# Patient Record
Sex: Male | Born: 1975 | Race: White | Hispanic: No | Marital: Married
Health system: Southern US, Community
[De-identification: ages and names within clinical notes are randomized; demographics above are authoritative.]

## PROBLEM LIST (undated history)

## (undated) DIAGNOSIS — M1990 Unspecified osteoarthritis, unspecified site: Secondary | ICD-10-CM

## (undated) DIAGNOSIS — T7840XA Allergy, unspecified, initial encounter: Secondary | ICD-10-CM

## (undated) DIAGNOSIS — Z5189 Encounter for other specified aftercare: Secondary | ICD-10-CM

## (undated) HISTORY — DX: Allergy, unspecified, initial encounter: T78.40XA

## (undated) HISTORY — DX: Unspecified osteoarthritis, unspecified site: M19.90

## (undated) HISTORY — DX: Encounter for other specified aftercare: Z51.89

## (undated) HISTORY — PX: ANTERIOR CRUCIATE LIGAMENT REPAIR: SHX115

---

## 2008-07-25 ENCOUNTER — Ambulatory Visit: Payer: Self-pay | Admitting: Dermatology

## 2010-07-23 ENCOUNTER — Ambulatory Visit: Payer: Self-pay | Admitting: Dermatology

## 2011-12-19 IMAGING — CR DG CHEST 2V
1 series · 2 of 2 positions shown · non-contrast
Comparison: none

REASON FOR EXAM: pos ppd
COMMENTS:

[Series 1: view not recorded · 0.17mm/px · 2 of 2 slices shown]
[im 1/2]
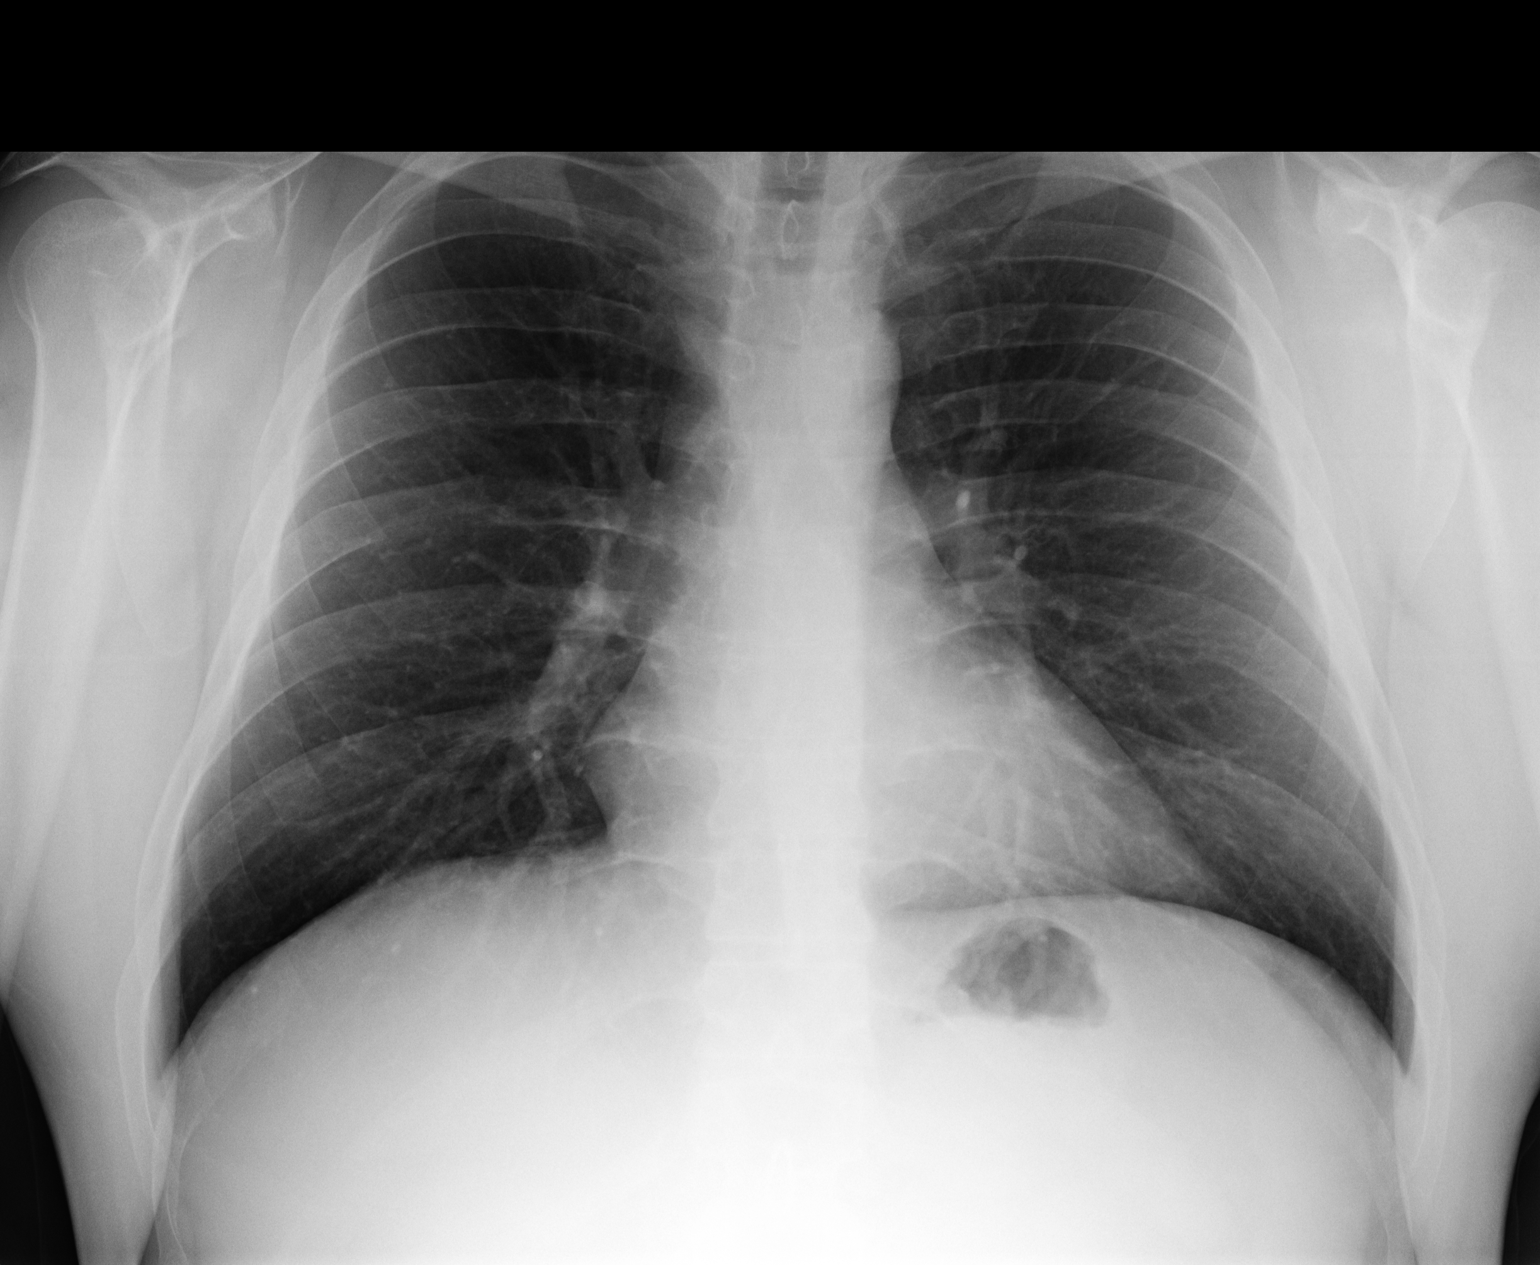
[im 2/2]
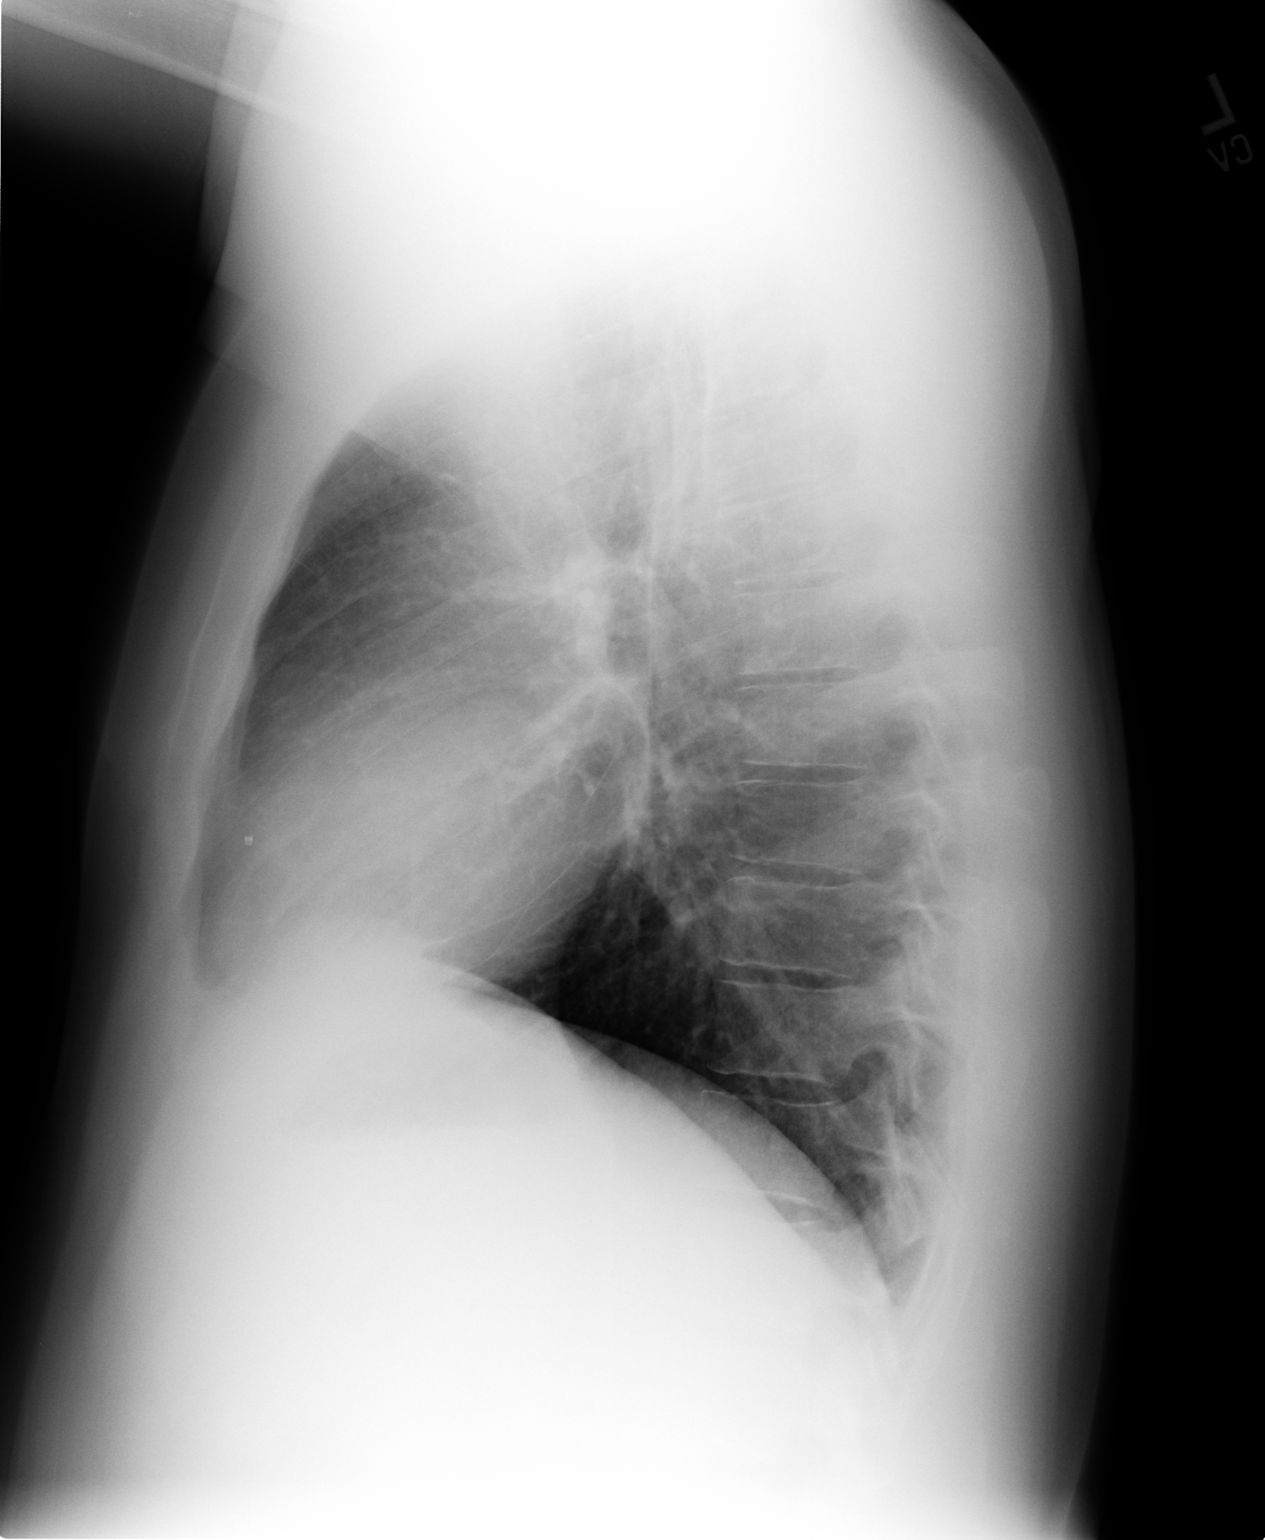

[2 of 2 positions shown; findings below may reference images not displayed]

PROCEDURE:     KDR - KDXR CHEST PA (OR AP) AND LAT  - July 23, 2010  [DATE]

RESULT:     Comparison is made to a prior exam 07/25/2008. The lung fields
remain clear. No pneumonia, pneumothorax or pleural effusion is seen. The
heart, mediastinal and osseous structures show no significant abnormalities.
Attention to the lung apices shows no infiltrate or cystic changes
suspicious for tuberculosis.
IMPRESSION: No significant abnormalities are noted.

## 2020-06-05 ENCOUNTER — Other Ambulatory Visit: Payer: Self-pay

## 2020-06-05 ENCOUNTER — Ambulatory Visit (INDEPENDENT_AMBULATORY_CARE_PROVIDER_SITE_OTHER): Payer: BC Managed Care – PPO | Admitting: Dermatology

## 2020-06-05 DIAGNOSIS — L409 Psoriasis, unspecified: Secondary | ICD-10-CM

## 2020-06-05 DIAGNOSIS — L4050 Arthropathic psoriasis, unspecified: Secondary | ICD-10-CM | POA: Diagnosis not present

## 2020-06-05 MED ORDER — ENBREL 50 MG/ML ~~LOC~~ SOSY: 50.0000 mg | PREFILLED_SYRINGE | SUBCUTANEOUS | 2 refills | Status: DC

## 2020-06-05 NOTE — Progress Notes (Signed)
   Follow-Up Visit   Subjective  Christopher Blankenship is a 44 y.o. male who presents for the following: Psoriasis (with psoritic arthritis of left knee, left arm, scalp - Enbrel 50 mg 2 times per week).    The following portions of the chart were reviewed this encounter and updated as appropriate:      Review of Systems:  No other skin or systemic complaints except as noted in HPI or Assessment and Plan.  Objective  Well appearing patient in no apparent distress; mood and affect are within normal limits.  A focused examination was performed including arms, legs, scalp. Relevant physical exam findings are noted in the Assessment and Plan.  Objective  Left knee, left arm, post scalp: 4.0 cm plaque of left knee.  1.5 cm plaque of left elbow.  6.0 x 3.0 cm plaque of post scalp.  No obvious joint swelling today.  Images             Assessment & Plan  Psoriasis Left knee, left arm, post scalp With Psoriatic Arthritis - worse if he is off medication.  Psoriasis is a chronic genetic/hereditary condition that is not curable.  It is a systemic condition that can involve multiple organ systems.  This patient has arthritic changes and psoriatic arthritis.  His "biologic" medication which is a systemic shot has potential side effects including immunosuppression and requires of laboratory monitoring and periodic follow-ups for monitoring of side effects.  He has seen Dr. Gavin Potters in the past and was diagnosed with psoriatic arthritis.  Discussed Humira, Cosentyx and Taltz.  Cosentyx and Taltz are not TNF inhibitors like Humira and Enbrel.  Cosentyx and Taltz have potential ability to help his joints as well as his current Enbrel but likely would do a better job in improving his skin psoriasis.   Patient would like to continue taking Enbrel since it is keeping his joint aches under control.  Conitnue Enbrel 50 mg 2 SQ qweek  Comprehensive metabolic panel - Left knee, left arm,  post scalp  CBC with Differential/Platelet - Left knee, left arm, post scalp  QuantiFERON-TB Gold Plus - Left knee, left arm, post scalp  etanercept (ENBREL) 50 MG/ML injection - Left knee, left arm, post scalp  Return in about 6 months (around 12/04/2020) for Psoriasis.  I, Joanie Coddington, CMA, am acting as scribe for Armida Sans, MD .  Documentation: I have reviewed the above documentation for accuracy and completeness, and I agree with the above.  Armida Sans, MD

## 2020-06-06 ENCOUNTER — Encounter: Payer: Self-pay | Admitting: Dermatology

## 2020-06-11 ENCOUNTER — Other Ambulatory Visit: Payer: Self-pay

## 2020-06-11 MED ORDER — ENBREL SURECLICK 50 MG/ML ~~LOC~~ SOAJ: 50.0000 mg | SUBCUTANEOUS | 5 refills | Status: DC

## 2020-06-11 NOTE — Progress Notes (Signed)
Clarification on Enbrel RX for [employer]

## 2020-06-18 ENCOUNTER — Telehealth: Payer: Self-pay

## 2020-06-18 LAB — CBC WITH DIFFERENTIAL/PLATELET
Basophils Absolute: 0.1 10*3/uL (ref 0.0–0.2)
Basos: 1 %
EOS (ABSOLUTE): 0.1 10*3/uL (ref 0.0–0.4)
Eos: 1 %
Hematocrit: 45.3 % (ref 37.5–51.0)
Hemoglobin: 15.6 g/dL (ref 13.0–17.7)
Immature Grans (Abs): 0 10*3/uL (ref 0.0–0.1)
Immature Granulocytes: 0 %
Lymphocytes Absolute: 2 10*3/uL (ref 0.7–3.1)
Lymphs: 23 %
MCH: 30.1 pg (ref 26.6–33.0)
MCHC: 34.4 g/dL (ref 31.5–35.7)
MCV: 88 fL (ref 79–97)
Monocytes Absolute: 0.6 10*3/uL (ref 0.1–0.9)
Monocytes: 7 %
Neutrophils Absolute: 5.9 10*3/uL (ref 1.4–7.0)
Neutrophils: 68 %
Platelets: 330 10*3/uL (ref 150–450)
RBC: 5.18 x10E6/uL (ref 4.14–5.80)
RDW: 12.7 % (ref 11.6–15.4)
WBC: 8.7 10*3/uL (ref 3.4–10.8)

## 2020-06-18 LAB — COMPREHENSIVE METABOLIC PANEL
ALT: 32 IU/L (ref 0–44)
AST: 21 IU/L (ref 0–40)
Albumin/Globulin Ratio: 1.8 (ref 1.2–2.2)
Albumin: 4.9 g/dL (ref 4.0–5.0)
Alkaline Phosphatase: 87 IU/L (ref 44–121)
BUN/Creatinine Ratio: 14 (ref 9–20)
BUN: 14 mg/dL (ref 6–24)
Bilirubin Total: 0.5 mg/dL (ref 0.0–1.2)
CO2: 25 mmol/L (ref 20–29)
Calcium: 9.9 mg/dL (ref 8.7–10.2)
Chloride: 102 mmol/L (ref 96–106)
Creatinine, Ser: 0.98 mg/dL (ref 0.76–1.27)
GFR calc Af Amer: 106 mL/min/{1.73_m2} (ref >59)
GFR calc non Af Amer: 92 mL/min/{1.73_m2} (ref >59)
Globulin, Total: 2.8 g/dL (ref 1.5–4.5)
Glucose: 124 mg/dL — ABNORMAL HIGH (ref 65–99)
Potassium: 4.4 mmol/L (ref 3.5–5.2)
Sodium: 141 mmol/L (ref 134–144)
Total Protein: 7.7 g/dL (ref 6.0–8.5)

## 2020-06-18 LAB — QUANTIFERON-TB GOLD PLUS
QuantiFERON Mitogen Value: >10 IU/mL
QuantiFERON Nil Value: 0.01 IU/mL
QuantiFERON TB1 Ag Value: 0.02 IU/mL
QuantiFERON TB2 Ag Value: 0.03 IU/mL
QuantiFERON-TB Gold Plus: NEGATIVE

## 2020-06-18 NOTE — Telephone Encounter (Signed)
Unable to leave a message.

## 2020-06-18 NOTE — Telephone Encounter (Signed)
-----   Message from Christopher Evener, MD sent at 06/18/2020 12:58 PM EST ----- Lab is all OK. Glucose slightly elevated.  Pt should discuss with PCP. Chemistries including liver; kidney tests all OK. Blood counts all normal / OK. TB test = Quantiferon Gold = negative/normal  Continue Enbrel.  Send Rx in. Keep 6 mos follow up appt.

## 2020-06-19 ENCOUNTER — Telehealth: Payer: Self-pay

## 2020-06-19 NOTE — Telephone Encounter (Signed)
Called patient but unable to leave message. The MAs are trying to reach him about his results but I also need to speak with patient regarding Enbrel denial and insurance coverage.

## 2020-06-24 ENCOUNTER — Telehealth: Payer: Self-pay

## 2020-06-24 NOTE — Telephone Encounter (Signed)
Patient left a message on the nurse line asking about Enbrel. Denial is on my desk.  Called patient back but no answer and no option for VM.

## 2020-06-24 NOTE — Telephone Encounter (Signed)
Tried calling patient this AM regarding Embrel denial and BX results.   NO answer and no option to leave voicemail.

## 2020-06-26 ENCOUNTER — Telehealth: Payer: Self-pay

## 2020-06-26 NOTE — Telephone Encounter (Signed)
LM on VM please call here to discuss lab results

## 2020-06-26 NOTE — Telephone Encounter (Signed)
We discussed recommendations in note last visit. I would start Taltz. May send RX Keep appts.

## 2020-06-26 NOTE — Telephone Encounter (Signed)
-----   Message from David C Kowalski, MD sent at 06/18/2020 12:58 PM EST ----- °Lab is all OK. °Glucose slightly elevated.  Pt should discuss with PCP. °Chemistries including liver; kidney tests all OK. °Blood counts all normal / OK. °TB test = Quantiferon Gold = negative/normal ° °Continue Enbrel.  Send Rx in. °Keep 6 mos follow up appt. ° °

## 2020-06-26 NOTE — Telephone Encounter (Signed)
Patient advised of lab results today.

## 2020-06-26 NOTE — Telephone Encounter (Signed)
Patient was advised today that Enbrel is no longer covered by insurance. His insurance prefers: Delrae Sawyers, Cristy Folks, Jeannie Done and [place].   Patient is willing to change therapies as long as the new medication still helps with the arthritis/joint pain.

## 2020-07-01 ENCOUNTER — Other Ambulatory Visit: Payer: Self-pay

## 2020-07-01 MED ORDER — TALTZ 80 MG/ML ~~LOC~~ SOAJ: 80.0000 mg | SUBCUTANEOUS | 1 refills | Status: DC

## 2020-07-01 MED ORDER — TALTZ 80 MG/ML ~~LOC~~ SOAJ: 80.0000 mg | Freq: Once | SUBCUTANEOUS | 0 refills | Status: AC

## 2020-07-01 MED ORDER — TALTZ 80 MG/ML ~~LOC~~ SOAJ: 160.0000 mg | SUBCUTANEOUS | 0 refills | Status: DC

## 2020-07-01 NOTE — Progress Notes (Signed)
Taltz RX to [employer]

## 2020-07-01 NOTE — Telephone Encounter (Signed)
Taltz sent in and left message for patient to return my call.

## 2020-07-01 NOTE — Telephone Encounter (Signed)
Patient advised of information. 

## 2020-08-12 ENCOUNTER — Other Ambulatory Visit: Payer: Self-pay

## 2020-08-12 ENCOUNTER — Other Ambulatory Visit: Payer: BC Managed Care – PPO

## 2020-08-12 DIAGNOSIS — Z20822 Contact with and (suspected) exposure to covid-19: Secondary | ICD-10-CM

## 2020-08-18 LAB — SARS-COV-2, NAA 2 DAY TAT

## 2020-08-18 LAB — NOVEL CORONAVIRUS, NAA: SARS-CoV-2, NAA: DETECTED — AB

## 2020-12-03 ENCOUNTER — Ambulatory Visit: Payer: BC Managed Care – PPO | Admitting: Dermatology

## 2020-12-03 ENCOUNTER — Other Ambulatory Visit: Payer: Self-pay

## 2020-12-03 DIAGNOSIS — L409 Psoriasis, unspecified: Secondary | ICD-10-CM

## 2020-12-03 DIAGNOSIS — L4050 Arthropathic psoriasis, unspecified: Secondary | ICD-10-CM

## 2020-12-03 MED ORDER — WYNZORA 0.005-0.064 % EX CREA: TOPICAL_CREAM | CUTANEOUS | 3 refills | Status: DC

## 2020-12-03 MED ORDER — TALTZ 80 MG/ML ~~LOC~~ SOAJ: 80.0000 mg | SUBCUTANEOUS | 5 refills | Status: DC

## 2020-12-03 NOTE — Progress Notes (Signed)
   Follow-Up Visit   Subjective  Christopher Blankenship is a 45 y.o. male who presents for the following: Psoriasis (Patient had significant pain and swelling of the feet in January around the same time he had Covid. He has been using the Taltz SQ QM which he started in early to late November. Patient states that joint pain and psoriasis has been about the same since switching to Taltz, but now he has a yellow discoloration of the toenails).  The following portions of the chart were reviewed this encounter and updated as appropriate:   Allergies  Meds  Problems  Med Hx  Surg Hx  Fam Hx     Review of Systems:  No other skin or systemic complaints except as noted in HPI or Assessment and Plan.  Objective  Well appearing patient in no apparent distress; mood and affect are within normal limits.  A focused examination was performed including the scalp and extremities. Relevant physical exam findings are noted in the Assessment and Plan.  Objective  trunk, extremities, scalp: L knee plaque 5.0 cm, L elbow 2.0 cm , 8.0 x 3.0 cm on the post scalp, R lat ankle 2.0 cm plaque - yellow discoloration at the tips of the toenails, psoriatic nail changes  Assessment & Plan  Psoriasis of the skin with psoriatic arthritis-severe on systemic biologic Taltz injections.  He is doing pretty well with the skin but has persistence of skin lesions.  His joints are also having persistent aches but doing pretty well.  As compared to being on Enbrel in the past, he is doing at least as well with Taltz. trunk, extremities, scalp With PsA - Flare of the joints which has now calmed down which may have been related to Covid infection in January Continue Taltz SQ injections QM. Reviewed risks of biologics including immunosuppression, infections, injection site reaction, and failure to improve condition. Goal is control of skin condition, not cure.  Some older biologics such as Humira and Enbrel may slightly increase risk of  malignancy and may worsen congestive heart failure. The use of biologics requires long term medication management, including periodic office visits and monitoring of blood work.  Start topical Wynzora QD-BID 5d/wk to aa's for skin psoriasis.   Advised patient if is experiencing joint pain he should follow up with his rheumatologist Dr. Gavin Potters. Patient demonstrated understanding and will contact him if/when joint flares occur or if he feels he needs more help with his joints.  I encouraged the patient to see Dr. [facility]le/rheumatologist.  Calcipotriene-Betameth Diprop [facility]) 0.005-0.064 % CREA - trunk, extremities, scalp  Ixekizumab (TALTZ) 80 MG/ML SOAJ - trunk, extremities, scalp  Other Related Medications etanercept (ENBREL) 50 MG/ML injection  Return in about 6 months (around 06/04/2021) for psoriasis follow up . Maylene Roes, CMA, am acting as scribe for Armida Sans, MD .  Documentation: I have reviewed the above documentation for accuracy and completeness, and I agree with the above.  Armida Sans, MD

## 2020-12-03 NOTE — Patient Instructions (Signed)

## 2020-12-04 ENCOUNTER — Encounter: Payer: Self-pay | Admitting: Dermatology

## 2020-12-10 ENCOUNTER — Other Ambulatory Visit: Payer: Self-pay

## 2020-12-10 ENCOUNTER — Other Ambulatory Visit: Payer: Self-pay | Admitting: Dermatology

## 2020-12-10 DIAGNOSIS — L409 Psoriasis, unspecified: Secondary | ICD-10-CM

## 2020-12-10 MED ORDER — TALTZ 80 MG/ML ~~LOC~~ SOAJ: 80.0000 mg | SUBCUTANEOUS | 5 refills | Status: DC

## 2021-05-12 ENCOUNTER — Other Ambulatory Visit: Payer: Self-pay | Admitting: Dermatology

## 2021-05-12 DIAGNOSIS — L409 Psoriasis, unspecified: Secondary | ICD-10-CM

## 2021-06-10 ENCOUNTER — Other Ambulatory Visit: Payer: Self-pay

## 2021-06-10 ENCOUNTER — Ambulatory Visit: Payer: Commercial Managed Care - PPO | Admitting: Dermatology

## 2021-06-10 DIAGNOSIS — L4050 Arthropathic psoriasis, unspecified: Secondary | ICD-10-CM

## 2021-06-10 DIAGNOSIS — L409 Psoriasis, unspecified: Secondary | ICD-10-CM

## 2021-06-10 MED ORDER — TALTZ 80 MG/ML ~~LOC~~ SOAJ: SUBCUTANEOUS | 6 refills | Status: DC

## 2021-06-10 MED ORDER — ZORYVE 0.3 % EX CREA: 1.0000 "application " | TOPICAL_CREAM | Freq: Every day | CUTANEOUS | 3 refills | Status: DC

## 2021-06-10 NOTE — Progress Notes (Signed)
   Follow-Up Visit   Subjective  Christopher Blankenship is a 45 y.o. male who presents for the following: Psoriasis (6 months f/u Psoriasis on the knees, elbows, pt taking Taltx injections once a month with a fair response on his skin,  helping his Psoriatic arthritis. ). Pt report Wynzora cream was too expensive so he did not pick that rx up from the pharmacy.   The following portions of the chart were reviewed this encounter and updated as appropriate:   Allergies  Meds  Problems  Med Hx  Surg Hx  Fam Hx     Review of Systems:  No other skin or systemic complaints except as noted in HPI or Assessment and Plan.  Objective  Well appearing patient in no apparent distress; mood and affect are within normal limits.  A focused examination was performed including face,exts,trunk. Relevant physical exam findings are noted in the Assessment and Plan.  exts, trunk Well-demarcated erythematous papules/plaques with silvery scale.    Assessment & Plan  Psoriasis with psoriatic arthritis. He is doing well on monthly Taltz injections.  His skin psoriasis is mostly clear with only a few areas residual.  His psoriatic arthritis is persistent but it is better.  He declines rheumatology evaluation today. exts, trunk Add topical Zoryve today. Samples given. Rx sent.  Psoriasis is a chronic non-curable, but treatable genetic/hereditary disease that may have other systemic features affecting other organ systems such as joints (Psoriatic Arthritis). It is associated with an increased risk of inflammatory bowel disease, heart disease, non-alcoholic fatty liver disease, and depression.     Discussed [place] may be an option in the future to add to [place]. Side effects of Otezla (apremilast) include diarrhea, nausea, headache, upper respiratory infection, depression, and weight decrease (5-10%). It should only be taken by pregnant women after a discussion regarding risks and benefits with their doctor. Goal is  control of skin condition, not cure.  The use of [place] requires long term medication management, including periodic office visits.   Related Procedures Comprehensive metabolic panel CBC with Differential/Platelet QuantiFERON-TB Gold Plus  Related Medications Roflumilast (ZORYVE) 0.3 % CREA Apply 1 application topically daily.  Ixekizumab (TALTZ) 80 MG/ML SOAJ INJECT ONE PEN SUBCUTANEOUSLY EVERY 4 WEEKS. REFRIGERATE. ALLOW PEN TO REACH ROOM TEMP PRIOR TO INJECTION.  Return in about 6 months (around 12/08/2021) for Psoriasis, .  I, Angelique Holm, CMA, am acting as scribe for Armida Sans, MD .  Documentation: I have reviewed the above documentation for accuracy and completeness, and I agree with the above.  Armida Sans, MD

## 2021-06-10 NOTE — Patient Instructions (Signed)

## 2021-06-11 ENCOUNTER — Encounter: Payer: Self-pay | Admitting: Dermatology

## 2021-12-07 ENCOUNTER — Other Ambulatory Visit: Payer: Self-pay | Admitting: Dermatology

## 2021-12-07 DIAGNOSIS — L409 Psoriasis, unspecified: Secondary | ICD-10-CM

## 2021-12-09 ENCOUNTER — Other Ambulatory Visit: Payer: Self-pay

## 2021-12-09 ENCOUNTER — Ambulatory Visit: Payer: Commercial Managed Care - PPO | Admitting: Dermatology

## 2021-12-09 DIAGNOSIS — L4050 Arthropathic psoriasis, unspecified: Secondary | ICD-10-CM

## 2021-12-09 DIAGNOSIS — L409 Psoriasis, unspecified: Secondary | ICD-10-CM | POA: Diagnosis not present

## 2021-12-09 MED ORDER — VTAMA 1 % EX CREA: 1.0000 "application " | TOPICAL_CREAM | Freq: Every day | CUTANEOUS | 6 refills | Status: DC

## 2021-12-09 MED ORDER — TALTZ 80 MG/ML ~~LOC~~ SOAJ: SUBCUTANEOUS | 6 refills | Status: DC

## 2021-12-09 NOTE — Progress Notes (Signed)
? ?  Follow-Up Visit ?  ?Subjective  ?Christopher Blankenship is a 46 y.o. male who presents for the following: Psoriasis (6 month follow up. Currently injecting Taltz 80 mg injections every 4 weeks. Patient reports unable to start zoryve due to cost. He feels arthritis has improved on taltz and he has stayed stable. He still has some active areas at scalp, elbows, left knee and ankles. ). ? ?The following portions of the chart were reviewed this encounter and updated as appropriate:  Allergies  Meds  Problems  Med Hx  Surg Hx  Fam Hx   ?  ?Review of Systems: No other skin or systemic complaints except as noted in HPI or Assessment and Plan. ? ?Objective  ?Well appearing patient in no apparent distress; mood and affect are within normal limits. ? ?A focused examination was performed including scalp, arms, legs, ankles and feet. Relevant physical exam findings are noted in the Assessment and Plan. ? ?extremites and trunk ?6 cm plaque at left knee, 2 cm plaque at left lateral elbow, 2 cm plaque at left lateral ankle and scaly plaque at left lower scalp.  ? ? ? ? ? ? ? ? ? ? ? ?Assessment & Plan  ?Psoriasis ?extremites and trunk ? ?Psoriasis is a chronic non-curable, but treatable genetic/hereditary disease that may have other systemic features affecting other organ systems such as joints (Psoriatic Arthritis). It is associated with an increased risk of inflammatory bowel disease, heart disease, non-alcoholic fatty liver disease, and depression.   ? ?Psoriasis with psoriatic arthritis. ?He is doing well on monthly Taltz injections.  His skin psoriasis is mostly clear with only a few areas residual.   ?His psoriatic arthritis is persistent but it is better.  He declines rheumatology evaluation today. ? ?Chronic and persistent condition with duration or expected duration over one year. Condition is symptomatic/ bothersome to patient. Not currently at goal. ? ?Long term medication management.  Patient is using long term  (months to years) prescription medication  to control their dermatologic condition.  These medications require periodic monitoring to evaluate for efficacy and side effects and may require periodic laboratory monitoring.  ?  ?Unable to start Zoryve due to cost.  ?Discussed [place] may be an option but will not add at this time.  ? ?Start Vtama cream 1 % - (samples given in office) apply to aa's qd  ?Rx sent to [facility] (info given to pt) 6 rfs ? ?Continue Taltz 80 mg/ml soaj q4 weeks. Rx sent to [facility]. 6 rfs  ? ?Lab is due.  Ordered lab. ? ?Ixekizumab (TALTZ) 80 MG/ML SOAJ - extremites and trunk ?INJECT ONE PEN SUBCUTANEOUSLY EVERY 4 WEEKS. REFRIGERATE. ALLOW PEN TO REACH ROOM TEMP PRIOR TO INJECTION. ?Tapinarof (VTAMA) 1 % CREA - extremites and trunk ?Apply 1 application. topically daily. Apply to aa's of body for psoriasis ? ?Return in about 6 months (around 06/11/2022) for psoriasis. ?I, Asher Muir, CMA, am acting as scribe for Armida Sans, MD. ?Documentation: I have reviewed the above documentation for accuracy and completeness, and I agree with the above. ? ?Armida Sans, MD ? ?

## 2021-12-09 NOTE — Patient Instructions (Signed)

## 2021-12-22 ENCOUNTER — Encounter: Payer: Self-pay | Admitting: Dermatology

## 2021-12-23 ENCOUNTER — Telehealth: Payer: Self-pay

## 2021-12-23 NOTE — Addendum Note (Signed)
Addended by: Asher Muir A on: 12/23/2021 08:17 AM ? ? Modules accepted: Orders ? ?

## 2021-12-23 NOTE — Telephone Encounter (Signed)
Spoke with patient and he is scheduled to see his PCP coming up, Dr. Dewaine Oats in [place]. Lab order faxed to his office to include our labs. aw ?

## 2021-12-23 NOTE — Telephone Encounter (Signed)
Tried calling patient to verify if he had his biologic yearly labs done with another provider or if we needed to order labs for him. Patient is currently on taltz for psoriasis. Left message for patient to return call to office.  ? ? ?

## 2021-12-28 ENCOUNTER — Telehealth: Payer: Self-pay

## 2021-12-28 NOTE — Telephone Encounter (Signed)
Patient called to let us know his PCP visit and lab work is scheduled to be done 02/15/22. aw

## 2022-03-10 ENCOUNTER — Telehealth: Payer: Self-pay

## 2022-03-10 DIAGNOSIS — L409 Psoriasis, unspecified: Secondary | ICD-10-CM

## 2022-03-10 NOTE — Telephone Encounter (Addendum)
   Called patient regarding need for Quantiferon Gold lab test and lipid panel. Copy of labs received from patient's pcp shows most recent labs but did not include these labs.  Left message for patient to call back.   ----- Message from Deirdre Evener, MD sent at 03/04/2022  6:26 PM EDT ----- This pt had lab done 02/15/2022 that I reviewed showing CBC and chem that were OK. The lab sheet shows that Lipids and Quantiferon Gold test also ordered, but I see no results for those tests. Please look at his media scanned lab or I can show you the lab. Need to find Quantiferon gold results if done. thanks

## 2022-03-17 NOTE — Telephone Encounter (Signed)
Tried another attempt to reach out to patient regarding need for additional labs. Patient did not answer. LMOM for patient to call office.   Will place future orders for labs needed (Lipid Panel and Quantiferon Gold test) and leave with front desk for patient to pick up.

## 2022-03-17 NOTE — Telephone Encounter (Signed)
-----   Message from Deirdre Evener, MD sent at 03/16/2022  6:29 PM EDT ----- It looks like this pts PCP did the labs scanned into Media.  So he did not do the ones we ordered.  That is OK, but we need to have a yearly Quantiferon gold /TB test while pt is on Taltz. Please contact pt and advise and re-order for him if he needs an order. ----- Message ----- From: Marilynn Rail, CMA Sent: 03/08/2022   8:07 AM EDT To: Deirdre Evener, MD  Hi Dr. Kirtland Bouchard  I did put in orders for these test, I do see where patient had cmp and cbc, but I also do not see the other test they should have had done. I will reach out to patient later today and see what is going on.   Thank Annebelle Bostic ----- Message ----- From: Deirdre Evener, MD Sent: 03/04/2022   6:29 PM EDT To: Deirdre Evener, MD; Marilynn Rail, CMA  This pt had lab done 02/15/2022 that I reviewed showing CBC and chem that were OK. The lab sheet shows that Lipids and Quantiferon Gold test also ordered, but I see no results for those tests. Please look at his media scanned lab or I can show you the lab. Need to find Quantiferon gold results if done. thanks

## 2022-04-05 ENCOUNTER — Telehealth: Payer: Self-pay

## 2022-04-05 NOTE — Telephone Encounter (Signed)
-----   Message from Deirdre Evener, MD sent at 04/01/2022  5:41 PM EDT ----- See labs scanned in from PCP, Dr Arlana Pouch. 02/15/2022 lab shows: Blood counts and Chemistries are OK.  Need to get TB test / Quantiferon gold (if not already done) as pt is on Taltz. Please contact Dr Maree Krabbe office to make sure whether they did TB test.   If not, order Quantiferon Gold and have pt get done at his convenience.

## 2022-04-05 NOTE — Telephone Encounter (Signed)
Called pt discussed labs scanned in from PCP, Dr Arlana Pouch. 02/15/2022 lab shows: Blood counts and Chemistries are OK.   Called Dr Maree Krabbe office to have most recent TB test / Quantiferon gold sent here, receptionist faxing labs today.

## 2022-04-05 NOTE — Telephone Encounter (Signed)
Called patient. Advised Dr. Gwen Pounds has received the negative TB test results. Continue Taltz. KSA for 6 month follow up. JP

## 2022-06-24 ENCOUNTER — Other Ambulatory Visit: Payer: Self-pay | Admitting: Dermatology

## 2022-06-24 ENCOUNTER — Ambulatory Visit: Payer: Commercial Managed Care - PPO | Admitting: Dermatology

## 2022-06-24 DIAGNOSIS — L409 Psoriasis, unspecified: Secondary | ICD-10-CM

## 2022-06-24 DIAGNOSIS — L4050 Arthropathic psoriasis, unspecified: Secondary | ICD-10-CM

## 2022-06-24 DIAGNOSIS — Z79899 Other long term (current) drug therapy: Secondary | ICD-10-CM

## 2022-06-24 MED ORDER — TALTZ 80 MG/ML ~~LOC~~ SOAJ: SUBCUTANEOUS | 6 refills | Status: DC

## 2022-06-24 NOTE — Progress Notes (Signed)
   Follow-Up Visit   Subjective  Christopher Blankenship is a 46 y.o. male who presents for the following: Psoriasis (Hx at trunk extremities and scalp. On taltz and staying clear. ).  The following portions of the chart were reviewed this encounter and updated as appropriate:  Allergies  Meds  Problems  Med Hx  Surg Hx  Fam Hx     Review of Systems: No other skin or systemic complaints except as noted in HPI or Assessment and Plan.  Objective  Well appearing patient in no apparent distress; mood and affect are within normal limits.  A focused examination was performed including scalp, right lateral ankle, left distal elbow, left knee, arms. Relevant physical exam findings are noted in the Assessment and Plan.  extremities, trunk, scalp Scalp clear, minimal pinkness at knees and elbows  2 persistent plaques at right lateral ankle and left distal elbow   Assessment & Plan  Psoriasis extremities, trunk, scalp  Psoriasis is a chronic non-curable, but treatable genetic/hereditary disease that may have other systemic features affecting other organ systems such as joints (Psoriatic Arthritis). It is associated with an increased risk of inflammatory bowel disease, heart disease, non-alcoholic fatty liver disease, and depression.     Psoriasis with psoriatic arthritis. He is doing well on monthly Taltz injections.  His skin psoriasis is mostly clear with only a few areas residual.   His psoriatic arthritis is persistent but it is better.    Chronic and persistent condition with duration or expected duration over one year. Condition is symptomatic/ bothersome to patient. Not currently at goal.   Long term medication management.  Patient is using long term (months to years) prescription medication  to control their dermatologic condition.  These medications require periodic monitoring to evaluate for efficacy and side effects and may require periodic laboratory monitoring.    Unable to start  Zoryve due to cost.  Patient has tried vtama cream 1 % samples. Prefers not using any topicals at this time.    Continue Taltz 80 mg/ml soaj q4 weeks. [facility]. 6 rfs   Order labs test at next follow up  Related Medications Tapinarof (VTAMA) 1 % CREA Apply 1 application. topically daily. Apply to aa's of body for psoriasis  Ixekizumab (TALTZ) 80 MG/ML SOAJ INJECT ONE PEN SUBCUTANEOUSLY EVERY 4 WEEKS. REFRIGERATE. ALLOW PEN TO REACH ROOM TEMP PRIOR TO INJECTION.  Long-term use of high-risk medication  Medication management  Psoriatic arthritis (HCC)   Return in about 6 months (around 12/23/2022) for psoriasis. IAsher Muir, CMA, am acting as scribe for Armida Sans, MD. Documentation: I have reviewed the above documentation for accuracy and completeness, and I agree with the above.  Armida Sans, MD

## 2022-06-24 NOTE — Patient Instructions (Addendum)
Ask PCP to order TB Gold lab at next labs in July   .     Due to recent changes in healthcare laws, you may see results of your pathology and/or laboratory studies on MyChart before the doctors have had a chance to review them. We understand that in some cases there may be results that are confusing or concerning to you. Please understand that not all results are received at the same time and often the doctors may need to interpret multiple results in order to provide you with the best plan of care or course of treatment. Therefore, we ask that you please give [place] 2 business days to thoroughly review all your results before contacting the office for clarification. Should we see a critical lab result, you will be contacted sooner.   If You Need Anything After Your Visit  If you have any questions or concerns for your doctor, please call our main line at [phone] and press option 4 to reach your doctor's medical assistant. If no one answers, please leave a voicemail as directed and we will return your call as soon as possible. Messages left after 4 pm will be answered the following business day.   You may also send [place] a message via MyChart. We typically respond to MyChart messages within 1-2 business days.  For prescription refills, please ask your pharmacy to contact our office. Our fax number is [phone].  If you have an urgent issue when the clinic is closed that cannot wait until the next business day, you can page your doctor at the number below.    Please note that while we do our best to be available for urgent issues outside of office hours, we are not available 24/7.   If you have an urgent issue and are unable to reach [place], you may choose to seek medical care at your doctor's office, retail clinic, urgent care center, or emergency room.  If you have a medical emergency, please immediately call 911 or go to the emergency department.  Pager Numbers  - Dr. Gwen Pounds:  [phone]  - Dr. Neale Burly: [phone]  - Dr. Roseanne Reno: [phone]  In the event of inclement weather, please call our main line at [phone] for an update on the status of any delays or closures.  Dermatology Medication Tips: Please keep the boxes that topical medications come in in order to help keep track of the instructions about where and how to use these. Pharmacies typically print the medication instructions only on the boxes and not directly on the medication tubes.   If your medication is too expensive, please contact our office at [phone] option 4 or send [place] a message through MyChart.   We are unable to tell what your co-pay for medications will be in advance as this is different depending on your insurance coverage. However, we may be able to find a substitute medication at lower cost or fill out paperwork to get insurance to cover a needed medication.   If a prior authorization is required to get your medication covered by your insurance company, please allow [place] 1-2 business days to complete this process.  Drug prices often vary depending on where the prescription is filled and some pharmacies may offer cheaper prices.  The website www.goodrx.com contains coupons for medications through different pharmacies. The prices here do not account for what the cost may be with help from insurance (it may be cheaper with your insurance), but the website can give you the price  if you did not use any insurance.  - You can print the associated coupon and take it with your prescription to the pharmacy.  - You may also stop by our office during regular business hours and pick up a GoodRx coupon card.  - If you need your prescription sent electronically to a different pharmacy, notify our office through [facility] or by phone at [phone] option 4.     Si Usted Necesita Algo Despus de Su Visita  Tambin puede enviarnos un mensaje a travs de Pharmacist, community. Por lo general  respondemos a los mensajes de MyChart en el transcurso de 1 a 2 das hbiles.  Para renovar recetas, por favor pida a su farmacia que se ponga en contacto con nuestra oficina. Harland Dingwall de fax es [facility] [phone].  Si tiene un asunto urgente cuando la clnica est cerrada y que no puede esperar hasta el siguiente da hbil, puede llamar/localizar a su doctor(a) al nmero que aparece a continuacin.   Por favor, tenga en cuenta que aunque hacemos todo lo posible para estar disponibles para asuntos urgentes fuera del horario de [place], no estamos disponibles las 24 horas del da, los 7 das de la [place].   Si tiene un problema urgente y no puede comunicarse con nosotros, puede optar por buscar atencin mdica  en el consultorio de su doctor(a), en una clnica privada, en un centro de atencin urgente o en una sala de emergencias.  Si tiene Engineering geologist, por favor llame inmediatamente al 911 o vaya a la sala de emergencias.  Nmeros de bper  - Dr. Nehemiah Massed: [phone]  - Dra. Moye: [phone]  - Dra. Nicole Kindred: [phone]  En caso de inclemencias del [place], por favor llame a Johnsie Kindred principal al [phone] para una actualizacin sobre el [place] de cualquier retraso o cierre.  Consejos para la medicacin en dermatologa: Por favor, guarde las cajas en las que vienen los medicamentos de uso tpico para ayudarle a seguir las instrucciones sobre dnde y cmo usarlos. Las farmacias generalmente imprimen las instrucciones del medicamento slo en las cajas y no directamente en los tubos del [place].   Si su medicamento es muy caro, por favor, pngase en contacto con Zigmund Daniel llamando al [phone] y presione la opcin 4 o envenos un mensaje a travs de Pharmacist, community.   No podemos decirle cul ser su copago por los medicamentos por adelantado ya que esto es diferente dependiendo de la cobertura de su seguro. Sin embargo, es posible que podamos encontrar un  medicamento sustituto a Electrical engineer un formulario para que el seguro cubra el medicamento que se considera necesario.   Si se requiere una autorizacin previa para que su compaa de seguros [place] su medicamento, por favor permtanos de 1 a 2 das hbiles para completar este proceso.  Los precios de los medicamentos varan con frecuencia dependiendo del Environmental consultant de dnde se surte la receta y alguna farmacias pueden ofrecer precios ms baratos.  El sitio web www.goodrx.com tiene cupones para medicamentos de Airline pilot. Los precios aqu no tienen en cuenta lo que podra costar con la ayuda del seguro (puede ser ms barato con su seguro), pero el sitio web puede darle el precio si no utiliz Research scientist (physical sciences).  - Puede imprimir el cupn correspondiente y llevarlo con su receta a la farmacia.  - Tambin puede pasar por nuestra oficina durante el horario de atencin regular y Charity fundraiser una tarjeta de cupones de GoodRx.  - Si necesita que su  receta se enve electrnicamente a una farmacia diferente, informe a nuestra oficina a travs de MyChart de [place] o por telfono llamando al [phone] y presione la opcin 4.

## 2022-07-13 ENCOUNTER — Encounter: Payer: Self-pay | Admitting: Dermatology

## 2022-12-30 ENCOUNTER — Ambulatory Visit: Payer: Commercial Managed Care - PPO | Admitting: Dermatology

## 2022-12-30 VITALS — BP 140/86 | Wt 171.6 lb

## 2022-12-30 DIAGNOSIS — Z7189 Other specified counseling: Secondary | ICD-10-CM | POA: Diagnosis not present

## 2022-12-30 DIAGNOSIS — L4050 Arthropathic psoriasis, unspecified: Secondary | ICD-10-CM | POA: Diagnosis not present

## 2022-12-30 DIAGNOSIS — B009 Herpesviral infection, unspecified: Secondary | ICD-10-CM

## 2022-12-30 DIAGNOSIS — L409 Psoriasis, unspecified: Secondary | ICD-10-CM

## 2022-12-30 DIAGNOSIS — Z79899 Other long term (current) drug therapy: Secondary | ICD-10-CM

## 2022-12-30 DIAGNOSIS — B001 Herpesviral vesicular dermatitis: Secondary | ICD-10-CM

## 2022-12-30 MED ORDER — TALTZ 80 MG/ML ~~LOC~~ SOAJ: 80.0000 mg | SUBCUTANEOUS | 6 refills | Status: DC

## 2022-12-30 MED ORDER — VALACYCLOVIR HCL 500 MG PO TABS: 500.0000 mg | ORAL_TABLET | Freq: Two times a day (BID) | ORAL | 11 refills | Status: DC

## 2022-12-30 NOTE — Patient Instructions (Signed)
Due to recent changes in healthcare laws, you may see results of your pathology and/or laboratory studies on MyChart before the doctors have had a chance to review them. We understand that in some cases there may be results that are confusing or concerning to you. Please understand that not all results are received at the same time and often the doctors may need to interpret multiple results in order to provide you with the best plan of care or course of treatment. Therefore, we ask that you please give us 2 business days to thoroughly review all your results before contacting the office for clarification. Should we see a critical lab result, you will be contacted sooner.   If You Need Anything After Your Visit  If you have any questions or concerns for your doctor, please call our main line at [phone] and press option 4 to reach your doctor's medical assistant. If no one answers, please leave a voicemail as directed and we will return your call as soon as possible. Messages left after 4 pm will be answered the following business day.   You may also send us a message via MyChart. We typically respond to MyChart messages within 1-2 business days.  For prescription refills, please ask your pharmacy to contact our office. Our fax number is [phone].  If you have an urgent issue when the clinic is closed that cannot wait until the next business day, you can page your doctor at the number below.    Please note that while we do our best to be available for urgent issues outside of office hours, we are not available 24/7.   If you have an urgent issue and are unable to reach us, you may choose to seek medical care at your doctor's office, retail clinic, urgent care center, or emergency room.  If you have a medical emergency, please immediately call 911 or go to the emergency department.  Pager Numbers  - Dr. Kowalski: [phone]  - Dr. Moye: [phone]  - Dr. Stewart:  [phone]  In the event of inclement weather, please call our main line at [phone] for an update on the status of any delays or closures.  Dermatology Medication Tips: Please keep the boxes that topical medications come in in order to help keep track of the instructions about where and how to use these. Pharmacies typically print the medication instructions only on the boxes and not directly on the medication tubes.   If your medication is too expensive, please contact our office at [phone] option 4 or send us a message through MyChart.   We are unable to tell what your co-pay for medications will be in advance as this is different depending on your insurance coverage. However, we may be able to find a substitute medication at lower cost or fill out paperwork to get insurance to cover a needed medication.   If a prior authorization is required to get your medication covered by your insurance company, please allow us 1-2 business days to complete this process.  Drug prices often vary depending on where the prescription is filled and some pharmacies may offer cheaper prices.  The website www.goodrx.com contains coupons for medications through different pharmacies. The prices here do not account for what the cost may be with help from insurance (it may be cheaper with your insurance), but the website can give you the price if you did not use any insurance.  - You can print the associated coupon and take it with   your prescription to the pharmacy.  - You may also stop by our office during regular business hours and pick up a GoodRx coupon card.  - If you need your prescription sent electronically to a different pharmacy, notify our office through [facility] MyChart or by phone at [phone] option 4.     Si Usted Necesita Algo Despus de Su Visita  Tambin puede enviarnos un mensaje a travs de MyChart. Por lo general respondemos a los mensajes de MyChart en el transcurso de 1 a 2  das hbiles.  Para renovar recetas, por favor pida a su farmacia que se ponga en contacto con nuestra oficina. Nuestro nmero de fax es el [phone].  Si tiene un asunto urgente cuando la clnica est cerrada y que no puede esperar hasta el siguiente da hbil, puede llamar/localizar a su doctor(a) al nmero que aparece a continuacin.   Por favor, tenga en cuenta que aunque hacemos todo lo posible para estar disponibles para asuntos urgentes fuera del horario de oficina, no estamos disponibles las 24 horas del da, los 7 das de la semana.   Si tiene un problema urgente y no puede comunicarse con nosotros, puede optar por buscar atencin mdica  en el consultorio de su doctor(a), en una clnica privada, en un centro de atencin urgente o en una sala de emergencias.  Si tiene una emergencia mdica, por favor llame inmediatamente al 911 o vaya a la sala de emergencias.  Nmeros de bper  - Dr. Kowalski: [phone]  - Dra. Moye: [phone]  - Dra. Stewart: [phone]  En caso de inclemencias del tiempo, por favor llame a nuestra lnea principal al [phone] para una actualizacin sobre el estado de cualquier retraso o cierre.  Consejos para la medicacin en dermatologa: Por favor, guarde las cajas en las que vienen los medicamentos de uso tpico para ayudarle a seguir las instrucciones sobre dnde y cmo usarlos. Las farmacias generalmente imprimen las instrucciones del medicamento slo en las cajas y no directamente en los tubos del medicamento.   Si su medicamento es muy caro, por favor, pngase en contacto con nuestra oficina llamando al [phone] y presione la opcin 4 o envenos un mensaje a travs de MyChart.   No podemos decirle cul ser su copago por los medicamentos por adelantado ya que esto es diferente dependiendo de la cobertura de su seguro. Sin embargo, es posible que podamos encontrar un medicamento sustituto a menor costo o llenar un formulario para que el  seguro cubra el medicamento que se considera necesario.   Si se requiere una autorizacin previa para que su compaa de seguros cubra su medicamento, por favor permtanos de 1 a 2 das hbiles para completar este proceso.  Los precios de los medicamentos varan con frecuencia dependiendo del lugar de dnde se surte la receta y alguna farmacias pueden ofrecer precios ms baratos.  El sitio web www.goodrx.com tiene cupones para medicamentos de diferentes farmacias. Los precios aqu no tienen en cuenta lo que podra costar con la ayuda del seguro (puede ser ms barato con su seguro), pero el sitio web puede darle el precio si no utiliz ningn seguro.  - Puede imprimir el cupn correspondiente y llevarlo con su receta a la farmacia.  - Tambin puede pasar por nuestra oficina durante el horario de atencin regular y recoger una tarjeta de cupones de GoodRx.  - Si necesita que su receta se enve electrnicamente a una farmacia diferente, informe a nuestra oficina a travs de MyChart de [place]   o por telfono llamando al [phone] y presione la opcin 4.  

## 2022-12-30 NOTE — Progress Notes (Signed)
   Follow-Up Visit   Subjective  Christopher Blankenship is a 47 y.o. male who presents for the following: Psoriasis with Psoriatic Arthritis trunk, arms, legs, scalp, Taltz sq injections q 4 wks, pt has lost a little bit of weight since starting Taltz  The following portions of the chart were reviewed this encounter and updated as appropriate: medications, allergies, medical history  Review of Systems:  No other skin or systemic complaints except as noted in HPI or Assessment and Plan.  Objective  Well appearing patient in no apparent distress; mood and affect are within normal limits.  Areas Examined: Arms, legs, scalp  Relevant exam findings are noted in the Assessment and Plan.  Assessment & Plan   Psoriasis  Related Procedures QuantiFERON-TB Gold Plus  Related Medications Tapinarof (VTAMA) 1 % CREA Apply 1 application. topically daily. Apply to aa's of body for psoriasis   PSORIASIS with PSORIATIC ARTHRITIS His skin psoriasis is mostly clear with only a few areas residual.   His psoriatic arthritis is persistent but it is better.   Scaly patch L elbow  1% BSA. Weight 171.6lbs Chronic condition with duration or expected duration over one year. Currently well-controlled.  Patient says joint pain controlled on Taltz  Treatment Plan: Cont Taltz sq injections q 4 wks Pt will have labs 07/24 with Dr. Arlana Pouch  Counseling and coordination of care for severe psoriasis on systemic treatment  psoriasis - severe on systemic treatment.  Psoriasis is a chronic non-curable, but treatable genetic/hereditary disease that may have other systemic features affecting other organ systems such as joints (Psoriatic Arthritis).  It is linked with heart disease, inflammatory bowel disease, non-alcoholic fatty liver disease, and depression. Significant skin psoriasis and/or psoriatic arthritis may have significant symptoms and affects activities of daily activity and often benefits from systemic  treatments.  These systemic treatments have some potential side effects including immunosuppression and require pre-treatment laboratory screening and periodic laboratory monitoring and periodic in person evaluation and monitoring by the attending dermatologist physician (long term medication management).   Reviewed risks of biologics including immunosuppression, infections, injection site reaction, and failure to improve condition. Goal is control of skin condition, not cure.  Some older biologics such as Humira and Enbrel may slightly increase risk of malignancy and may worsen congestive heart failure.  Taltz and Cosentyx may cause inflammatory bowel disease to flare. The use of biologics requires long term medication management, including periodic office visits and monitoring of blood work.   HERPESVIRAL INFECTION (COLD SORES) Exam: resolving blister L lower lip Chronic and persistent condition with duration or expected duration over one year. Condition is bothersome/symptomatic for patient. Currently flared.  Herpes Simplex Virus = Cold Sores = Fever Blisters is a chronic recurring blistering; scabbing sore-producing viral infection that is recurrent usually in the same area triggered by stress, sun/UV exposure and trauma.  It is infectious and can be spread from person to person by direct contact.  It is not curable, but is treatable with topical and oral medication.  Treatment Plan Take Valacyclovir 500mg  1 po bid for 5 days prn flares  Return in about 6 months (around 07/02/2023) for Psoriasis f/u.  I, Ardis Rowan, RMA, am acting as scribe for Armida Sans, MD .   Documentation: I have reviewed the above documentation for accuracy and completeness, and I agree with the above.  Armida Sans, MD

## 2023-01-12 ENCOUNTER — Encounter: Payer: Self-pay | Admitting: Dermatology

## 2023-02-17 ENCOUNTER — Telehealth: Payer: Self-pay

## 2023-02-17 LAB — QUANTIFERON-TB GOLD PLUS
QuantiFERON Mitogen Value: >10 IU/mL
QuantiFERON Nil Value: 0.05 IU/mL
QuantiFERON TB1 Ag Value: 0.06 IU/mL
QuantiFERON TB2 Ag Value: 0.08 IU/mL
QuantiFERON-TB Gold Plus: NEGATIVE

## 2023-02-17 NOTE — Telephone Encounter (Signed)
Discussed lab results with patient. 

## 2023-02-17 NOTE — Telephone Encounter (Signed)
-----   Message from Armida Sans sent at 02/17/2023 12:15 PM EDT ----- TB test / Quantiferon gold from 02/14/2023 is Negative = normal Continue Taltz for Psoriasis Keep follow up appt

## 2023-06-30 ENCOUNTER — Ambulatory Visit: Payer: Commercial Managed Care - PPO | Admitting: Dermatology

## 2023-06-30 DIAGNOSIS — L409 Psoriasis, unspecified: Secondary | ICD-10-CM | POA: Diagnosis not present

## 2023-06-30 DIAGNOSIS — Z79899 Other long term (current) drug therapy: Secondary | ICD-10-CM

## 2023-06-30 DIAGNOSIS — Z7189 Other specified counseling: Secondary | ICD-10-CM

## 2023-06-30 DIAGNOSIS — L4050 Arthropathic psoriasis, unspecified: Secondary | ICD-10-CM | POA: Diagnosis not present

## 2023-06-30 NOTE — Patient Instructions (Signed)

## 2023-06-30 NOTE — Progress Notes (Signed)
   Follow-Up Visit   Subjective  Christopher Blankenship is a 47 y.o. male who presents for the following: Psoriasis doing very well on Taltz, with no active psoriasis and no joint pain.   The following portions of the chart were reviewed this encounter and updated as appropriate: medications, allergies, medical history  Review of Systems:  No other skin or systemic complaints except as noted in HPI or Assessment and Plan.  Objective  Well appearing patient in no apparent distress; mood and affect are within normal limits.  Areas Examined: The face, trunk  Relevant exam findings are noted in the Assessment and Plan.   Assessment & Plan   PSORIASIS with psoriatic arthritis Tiny plaque on the L knee. 1% BSA on Taltz. Previously on Enbrel which did not control patient's psoraisis. Pt's insurance states that starting January 2025 they will no longer cover Altamease [place], even though the medication has been approved through 05/2024. Should that happen we will switch to Cosentyx.   Chronic condition with duration or expected duration over one year. Currently well-controlled.  Joint pain is not resolved but improved on Taltz  Treatment Plan: Continue Taltz SQ QM. Reviewed risks of biologics including immunosuppression, infections, injection site reaction, and failure to improve condition. Goal is control of skin condition, not cure.  Some older biologics such as Humira and Enbrel may slightly increase risk of malignancy and may worsen congestive heart failure.  Taltz and Cosentyx may cause inflammatory bowel disease to flare. The use of biologics requires long term medication management, including periodic office visits and monitoring of blood work.  Counseling on psoriasis and coordination of care  psoriasis is a chronic non-curable, but treatable genetic/hereditary disease that may have other systemic features affecting other organ systems such as joints (Psoriatic Arthritis). It is associated with an  increased risk of inflammatory bowel disease, heart disease, non-alcoholic fatty liver disease, and depression.  Treatments include light and laser treatments; topical medications; and systemic medications including oral and injectables.  Weight loss - being followed by Dr. Arlana Pouch, who has evaluated weight loss, and is not concerned per patient.  Return in about 6 months (around 12/28/2023) for TBSE and psoriasis follow up.  Maylene Roes, CMA, am acting as scribe for Armida Sans, MD .   Documentation: I have reviewed the above documentation for accuracy and completeness, and I agree with the above.  Armida Sans, MD

## 2023-07-04 ENCOUNTER — Telehealth: Payer: Self-pay

## 2023-07-04 NOTE — Telephone Encounter (Signed)
We had previously discussed switching to Cosentyx since patient's plan will no longer cover it starting January 2025. According to patient's insurance they cover Adalimumab-adaz, Bimzelx, Hyrimoz, [place], [place], [place], [place], and [place]. They cover Cosentyx for PsA, but not psoraisis alone. Please advise.

## 2023-07-05 ENCOUNTER — Encounter: Payer: Self-pay | Admitting: Dermatology

## 2023-07-06 MED ORDER — COSENTYX SENSOREADY (300 MG) 150 MG/ML ~~LOC~~ SOAJ: 300.0000 mg | SUBCUTANEOUS | 0 refills | Status: DC

## 2023-07-06 MED ORDER — COSENTYX SENSOREADY (300 MG) 150 MG/ML ~~LOC~~ SOAJ: 300.0000 mg | SUBCUTANEOUS | 5 refills | Status: DC

## 2023-07-06 NOTE — Telephone Encounter (Signed)
We need to hold Cosentyx until 2025 due to Cosentyx not preferred for 2024 calendar year. Aw Will send myself a reminder to send to [place] as of the New Year. aw

## 2023-07-06 NOTE — Addendum Note (Signed)
Addended by: Cari Caraway A on: 07/06/2023 08:10 AM   Modules accepted: Orders

## 2023-07-06 NOTE — Telephone Encounter (Signed)
Advised patient that we are starting process of switching to Cosentyx since his insurance will no longer cover Taltz starting in January.

## 2023-07-21 ENCOUNTER — Other Ambulatory Visit: Payer: Self-pay | Admitting: Dermatology

## 2023-07-21 DIAGNOSIS — L4050 Arthropathic psoriasis, unspecified: Secondary | ICD-10-CM

## 2023-07-21 DIAGNOSIS — L409 Psoriasis, unspecified: Secondary | ICD-10-CM

## 2023-08-17 ENCOUNTER — Other Ambulatory Visit: Payer: Self-pay

## 2023-08-17 MED ORDER — COSENTYX SENSOREADY (300 MG) 150 MG/ML ~~LOC~~ SOAJ: 300.0000 mg | SUBCUTANEOUS | 0 refills | Status: DC

## 2023-08-17 MED ORDER — COSENTYX SENSOREADY (300 MG) 150 MG/ML ~~LOC~~ SOAJ: 300.0000 mg | SUBCUTANEOUS | 5 refills | Status: DC

## 2023-08-17 NOTE — Progress Notes (Signed)
 Resent Cosentyx RX to [facility] per patient request for the new year. aw

## 2023-08-23 LAB — LAB REPORT - SCANNED: EGFR: 92

## 2023-09-19 ENCOUNTER — Other Ambulatory Visit: Payer: Self-pay

## 2023-09-19 ENCOUNTER — Encounter: Payer: Self-pay | Admitting: Dermatology

## 2023-09-19 ENCOUNTER — Telehealth: Payer: Self-pay

## 2023-09-19 DIAGNOSIS — L409 Psoriasis, unspecified: Secondary | ICD-10-CM

## 2023-09-19 MED ORDER — BIMZELX 160 MG/ML ~~LOC~~ SOAJ
320.0000 mg | SUBCUTANEOUS | 0 refills | Status: DC
Start: 2023-09-19 — End: 2024-02-06

## 2023-09-19 MED ORDER — BIMZELX 160 MG/ML ~~LOC~~ SOAJ
320.0000 mg | SUBCUTANEOUS | 0 refills | Status: DC
Start: 2023-09-19 — End: 2023-09-19

## 2023-09-19 NOTE — Telephone Encounter (Signed)
 Bimzelx  loading and maintenance dose sent to [facility]. Patient advised of change and that we will likely have to another prior authorization for medication even though it's listed as a preferred drug. Patient voiced understanding.

## 2023-09-19 NOTE — Telephone Encounter (Signed)
 Fax received from patient's insurance that Cosentyx  is not a covered benefit. Primary preferred drugs are adalimumab-adaz, Bimzelx , Hyrimoz, Otezla, Skyrizi, Sotyktu, Stelara, and Tremfya. Please advise.

## 2023-09-19 NOTE — Progress Notes (Signed)
 [facility] uses [facility], not [facility]. Senderra notified to cancel prescription.

## 2023-09-20 ENCOUNTER — Telehealth: Payer: Self-pay

## 2023-09-20 NOTE — Telephone Encounter (Signed)
Patient called to let Morrie Sheldon know his rx for Bimzelx should be delivered to him this week, he would like to thank Morrie Sheldon for her help

## 2024-01-05 ENCOUNTER — Ambulatory Visit: Payer: Commercial Managed Care - PPO | Admitting: Dermatology

## 2024-02-06 ENCOUNTER — Ambulatory Visit: Admitting: Dermatology

## 2024-02-06 ENCOUNTER — Encounter: Payer: Self-pay | Admitting: Dermatology

## 2024-02-06 DIAGNOSIS — L409 Psoriasis, unspecified: Secondary | ICD-10-CM

## 2024-02-06 DIAGNOSIS — L4050 Arthropathic psoriasis, unspecified: Secondary | ICD-10-CM | POA: Diagnosis not present

## 2024-02-06 DIAGNOSIS — Z79899 Other long term (current) drug therapy: Secondary | ICD-10-CM

## 2024-02-06 DIAGNOSIS — Z7189 Other specified counseling: Secondary | ICD-10-CM

## 2024-02-06 MED ORDER — BIMZELX 160 MG/ML ~~LOC~~ SOAJ: 320.0000 mg | SUBCUTANEOUS | 3 refills | Status: AC

## 2024-02-06 NOTE — Progress Notes (Signed)
   Follow-Up Visit   Subjective  Christopher Blankenship is a 48 y.o. male who presents for the following: 8 months Psoriasis doing well on Bimzelx , started Bimzelz ~ 4 months ago.  Psoriasis with Psoriatic Arthritis trunk, arms, legs, scalp, previous treatment Taltz  helped, Insurance stopped covering Taltz  6 months ago   The following portions of the chart were reviewed this encounter and updated as appropriate: medications, allergies, medical history  Review of Systems:  No other skin or systemic complaints except as noted in HPI or Assessment and Plan.  Objective  Well appearing patient in no apparent distress; mood and affect are within normal limits.  Areas Examined:face,arms,legs, scalp  Relevant exam findings are noted in the Assessment and Plan.   Assessment & Plan   PSORIASIS   Related Procedures QuantiFERON-TB Gold Plus Related Medications bimekizumab -bkzx (BIMZELX ) 160 MG/ML pen Inject 2 mLs (320 mg total) into the skin every 8 (eight) weeks. inject 2 pens (320mg /57mL) SQ Q8W  PSORIASIS  And  Psoriatic Arthritis Clear skin arms, legs,scalp on Bimzlex injection 0% BSA. Chronic and persistent condition with duration or expected duration over one year. Condition is symptomatic/ bothersome to patient. Not currently at goal.  Joints doing well, but was better on Taltz   Treatment Plan: Lab ordered QuantiFERON TB gold plus  Lab from Dr Corlis from 08/22/2023 showed:  Chemistries including kidney all OK.  Lipids OK  Liver test = GGT normal   We will order labs at the next office visit in 6 months   Continue Bimzelx  320 mg once every 8 weeks   May consider referral to a rheumatologist at the next office visit    Counseling on psoriasis and coordination of care  psoriasis is a chronic non-curable, but treatable genetic/hereditary disease that may have other systemic features affecting other organ systems such as joints (Psoriatic Arthritis). It is associated with an increased  risk of inflammatory bowel disease, heart disease, non-alcoholic fatty liver disease, and depression.  Treatments include light and laser treatments; topical medications; and systemic medications including oral and injectables.  Counseling and coordination of care for severe psoriasis on systemic treatment  psoriasis - severe on systemic treatment.  Psoriasis is a chronic non-curable, but treatable genetic/hereditary disease that may have other systemic features affecting other organ systems such as joints (Psoriatic Arthritis).  It is linked with heart disease, inflammatory bowel disease, non-alcoholic fatty liver disease, and depression. Significant skin psoriasis and/or psoriatic arthritis may have significant symptoms and affects activities of daily activity and often benefits from systemic treatments.  These systemic treatments have some potential side effects including immunosuppression and require pre-treatment laboratory screening and periodic laboratory monitoring and periodic in person evaluation and monitoring by the attending dermatologist physician (long term medication management).    Reviewed risks of biologics including immunosuppression, infections, injection site reaction, and failure to improve condition. Goal is control of skin condition, not cure.  Some older biologics such as Humira and Enbrel  may slightly increase risk of malignancy and may worsen congestive heart failure.  Taltz  and Cosentyx  may cause inflammatory bowel disease to flare. The use of biologics requires long term medication management, including periodic office visits and monitoring of blood work.  Return in about 6 months (around 08/07/2024) for Psoriasis .  IFay Kirks, CMA, am acting as scribe for Alm Rhyme, MD .   Documentation: I have reviewed the above documentation for accuracy and completeness, and I agree with the above.  Alm Rhyme, MD

## 2024-02-06 NOTE — Patient Instructions (Signed)

## 2024-02-10 LAB — QUANTIFERON-TB GOLD PLUS
QuantiFERON Mitogen Value: >10 [IU]/mL
QuantiFERON Nil Value: 0.04 [IU]/mL
QuantiFERON TB1 Ag Value: 0.07 [IU]/mL
QuantiFERON TB2 Ag Value: 0.05 [IU]/mL
QuantiFERON-TB Gold Plus: NEGATIVE

## 2024-02-13 ENCOUNTER — Ambulatory Visit: Payer: Self-pay | Admitting: Dermatology

## 2024-02-13 NOTE — Telephone Encounter (Signed)
 Patient informed of lab results. Prescription sent on 02/06/24 for 6 mths.

## 2024-02-13 NOTE — Telephone Encounter (Signed)
-----   Message from Christopher Blankenship sent at 02/13/2024  5:35 PM EDT ----- Lab 02/06/2024  - TB test / Quantiferon Gold = Negative / Normal Pt on Bimzelx  for Psoriasis and PsA  Continue Bimzelx . Send Bimzelx  to pharmacy if not already done. Keep 6 mos follow up appt ----- Message ----- From: Interface, [facility] Lab Results In Sent: 02/10/2024   7:36 AM EDT To: Christopher JAYSON Rhyme, MD

## 2024-04-03 ENCOUNTER — Ambulatory Visit (INDEPENDENT_AMBULATORY_CARE_PROVIDER_SITE_OTHER)

## 2024-04-03 VITALS — BP 130/80 | HR 88 | Wt 156.0 lb

## 2024-04-03 DIAGNOSIS — L409 Psoriasis, unspecified: Secondary | ICD-10-CM | POA: Insufficient documentation

## 2024-04-03 DIAGNOSIS — E785 Hyperlipidemia, unspecified: Secondary | ICD-10-CM | POA: Insufficient documentation

## 2024-04-03 DIAGNOSIS — Z125 Encounter for screening for malignant neoplasm of prostate: Secondary | ICD-10-CM

## 2024-04-03 DIAGNOSIS — E782 Mixed hyperlipidemia: Secondary | ICD-10-CM

## 2024-04-03 DIAGNOSIS — Z8042 Family history of malignant neoplasm of prostate: Secondary | ICD-10-CM | POA: Diagnosis not present

## 2024-04-03 DIAGNOSIS — M5412 Radiculopathy, cervical region: Secondary | ICD-10-CM | POA: Insufficient documentation

## 2024-04-03 MED ORDER — ROSUVASTATIN CALCIUM 10 MG PO TABS: 10.0000 mg | ORAL_TABLET | Freq: Every day | ORAL | 3 refills | Status: DC

## 2024-04-03 MED ORDER — GABAPENTIN 100 MG PO CAPS: 100.0000 mg | ORAL_CAPSULE | Freq: Two times a day (BID) | ORAL | 3 refills | Status: DC

## 2024-04-03 MED ORDER — ROSUVASTATIN CALCIUM 10 MG PO TABS: 10.0000 mg | ORAL_TABLET | Freq: Every day | ORAL | 3 refills | Status: AC

## 2024-04-03 MED ORDER — GABAPENTIN 100 MG PO CAPS: 100.0000 mg | ORAL_CAPSULE | Freq: Two times a day (BID) | ORAL | 3 refills | Status: AC

## 2024-04-03 NOTE — Progress Notes (Signed)
 New Patient Visit   Physician: Tyrie Porzio A Kinslei Labine, MD  Patient: Christopher Blankenship   DOB: 1976/04/17   47 y.o. Male  MRN: [id number] Visit Date: 04/03/2024   Chief Complaint  Patient presents with   Establish Care   Subjective  Christopher Blankenship is a 48 y.o. male who presents today as a new patient to establish care.   HPI  Discussed the use of AI scribe software for clinical note transcription with the patient, who gave verbal consent to proceed.  History of Present Illness   Christopher Blankenship is a 48 year old male who presents for medication management of rosuvastatin  and gabapentin .  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 - Diagnosed with spinal stenosis at C4 and C5 by MRI 10-12 years ago - Experiences neuropathic pain radiating from neck to shoulder, elbow, and fingers - Gabapentin  200 mg at night provides effective symptom control - Mild sedation as a side effect of gabapentin   Psoriatic arthritis - Managed with Bimzelx  injections every two months - Recent laboratory monitoring including liver enzymes and TB testing completed 1.5 months ago  Nephrolithiasis - History of nephrolithiasis  Respiratory infections - History of COVID-19 infection twice - History of pneumonia once - No current respiratory symptoms  Cancer risk assessment - Family history of prostate cancer - No prior PSA testing  Psychosocial stressors - Experiencing stress related to parents' health issues - mother has dementia.  Note daughter recently to college       Physically active at work, healthy diet, does not typically receive immunizations.    ASSESSMENT & PLAN  Encounter Diagnoses  Name Primary?    Cervical radiculopathy Yes   Mixed hyperlipidemia    Psoriasis    FH: prostate cancer    Prostate cancer screening     Orders Placed This Encounter  Procedures   PSA   CBC with Differential/Platelet   Comprehensive metabolic panel with GFR   Hemoglobin A1c   Lipid panel   Urinalysis, Routine w reflex microscopic    Assessment and Plan    Hyperlipidemia Hyperlipidemia is well-controlled with rosuvastatin .- Refill rosuvastatin  prescription.  Cervical radiculopathy due to cervical spinal stenosis Chronic cervical radiculopathy due to cervical spinal stenosis, Gabapentin  effectively manages nerve pain with minimal side effects, primarily grogginess. Gabapentin  is non-addictive and well-tolerated for long-term use. - Refill gabapentin  prescription with a 90-day supply and three refills.      Psoriasis - Seems to be tolerating Bimzelx .  LFT's with next lab tests  FH prostate cancer - PSA screening with next labs.   Fu mid Nov with labs otherwise or PRN       Objective  BP 130/80 (BP Location: Left Arm, Patient Position: Sitting, Cuff Size: Small)   Pulse 88   Wt 156 lb (70.8 kg)   SpO2 97%      Review of Systems  Constitutional:  Negative for chills, fever and weight loss.  Eyes:  Negative for blurred vision. h Respiratory:  Negative for cough and shortness of breath.   Cardiovascular:  Negative for chest pain and palpitations.  Skin:  Negative for rash.  Psychiatric/Behavioral:  Negative for depression. The patient is not nervous/anxious.      Physical Exam Physical Exam Vitals  reviewed.  Constitutional:      Appearance: Normal appearance. Well-developed with normal weight.  HENT:     Head: Normocephalic and atraumatic.  Normal mucous membranes, no oral lesions Eyes:     Pupils: Pupils are equal, round, and reactive to light.  Neck:     Thyroid: No thyroid mass or thyromegaly.  Cardiovascular:     Rate and Rhythm: Normal rate and regular rhythm. Normal  heart sounds. Normal peripheral pulses Pulmonary:     Normal breath sounds with normal effort Abdominal:   Abdomen is soft, without tenderness or noted hepatosplenomegaly Musculoskeletal:        General: No swelling or edema  Lymphadenopathy:     Cervical: No cervical adenopathy.  Skin:    General: Skin is warm and dry without noticeable rash. Neurological:     General: No focal deficit present.  Psychiatric:        Mood and Affect: Mood, behavior and cognition normal   Past Medical History:  Diagnosis Date   Allergy    Arthritis    Blood transfusion without reported diagnosis    Past Surgical History:  Procedure Laterality Date   ANTERIOR CRUCIATE LIGAMENT REPAIR Left    Family Status  Relation Name Status   Mother  Alive   Father  Alive   Brother  Alive   Daughter  Alive   MGM  Deceased   MGF  Deceased   PGM  Deceased   PGF  Deceased  No partnership data on file   Family History  Problem Relation Age of Onset   Alzheimer's disease Mother    Heart attack Father    Cancer - Prostate Father    Healthy Brother    Healthy Daughter    Social History   Socioeconomic History   Marital status: Married    Spouse name: Not on file   Number of children: Not on file   Years of education: Not on file   Highest education level: Not on file  Occupational History   Not on file  Tobacco Use   Smoking status: Never   Smokeless tobacco: Never  Vaping Use   Vaping status: Never Used  Substance and Sexual Activity   Alcohol use: Never   Drug use: Never   Sexual activity: Yes  Other Topics Concern   Not on file  Social History Narrative   Not on file   Social Drivers of Health   Financial Resource Strain: Not on file  Food Insecurity: Not on file  Transportation Needs: Not on file  Physical Activity: Not on file  Stress: Not on file  Social Connections: Not on file   Outpatient Medications Prior to Visit  Medication Sig   bimekizumab -bkzx (BIMZELX ) 160  MG/ML pen Inject 2 mLs (320 mg total) into the skin every 8 (eight) weeks. inject 2 pens (320mg /76mL) SQ Q8W   valACYclovir  (VALTREX ) 500 MG tablet Take 1 tablet (500 mg total) by mouth 2 (two) times daily. Take 2 po bid for 5 days prn cold sores   [DISCONTINUED] gabapentin  (NEURONTIN ) 100 MG capsule Take by mouth.   [DISCONTINUED] rosuvastatin  (CRESTOR ) 10 MG tablet Take by mouth.   No facility-administered medications prior to visit.   Allergies  Allergen Reactions   Sulfa Antibiotics      There is no immunization history on file for this patient.  Health Maintenance  Topic Date Due   COVID-19 Vaccine (1) Never done   HIV Screening  Never done   Hepatitis  C Screening  Never done   DTaP/Tdap/Td (1 - Tdap) Never done   Hepatitis B Vaccines 19-59 Average Risk (1 of 3 - 19+ 3-dose series) Never done   Colonoscopy  Never done   INFLUENZA VACCINE  03/09/2024   Pneumococcal Vaccine  Aged Out   HPV VACCINES  Aged Out   Meningococcal B Vaccine  Aged Out    Patient Care Team: Corlis Honor BROCKS, MD as PCP - General (Internal Medicine)  Depression Screen     No data to display           Parris DELENA Juneau, MD  [facility] [phone] (phone) [phone] (fax)  [facility]

## 2024-06-14 ENCOUNTER — Telehealth: Payer: Self-pay

## 2024-06-14 NOTE — Telephone Encounter (Signed)
 Mychart message sent to pt.

## 2024-06-18 ENCOUNTER — Other Ambulatory Visit

## 2024-06-18 DIAGNOSIS — Z8042 Family history of malignant neoplasm of prostate: Secondary | ICD-10-CM

## 2024-06-18 DIAGNOSIS — E782 Mixed hyperlipidemia: Secondary | ICD-10-CM

## 2024-06-18 DIAGNOSIS — L409 Psoriasis, unspecified: Secondary | ICD-10-CM

## 2024-06-18 DIAGNOSIS — M5412 Radiculopathy, cervical region: Secondary | ICD-10-CM

## 2024-06-18 DIAGNOSIS — Z125 Encounter for screening for malignant neoplasm of prostate: Secondary | ICD-10-CM

## 2024-06-18 LAB — URINALYSIS, ROUTINE W REFLEX MICROSCOPIC
Bilirubin Urine: NEGATIVE
Glucose, UA: NEGATIVE
Hgb urine dipstick: NEGATIVE
Ketones, ur: NEGATIVE
Leukocytes,Ua: NEGATIVE
Nitrite: NEGATIVE
Protein, ur: NEGATIVE
Specific Gravity, Urine: 1.023 (ref 1.001–1.035)
pH: 6 (ref 5.0–8.0)

## 2024-06-19 LAB — COMPREHENSIVE METABOLIC PANEL WITH GFR
AG Ratio: 1.7 (calc) (ref 1.0–2.5)
ALT: 28 U/L (ref 9–46)
AST: 20 U/L (ref 10–35)
Albumin: 4.5 g/dL (ref 3.6–5.1)
Alkaline phosphatase (APISO): 67 U/L (ref 35–144)
BUN: 14 mg/dL (ref 7–25)
CO2: 29 mmol/L (ref 20–32)
Calcium: 9.2 mg/dL (ref 8.6–10.3)
Chloride: 103 mmol/L (ref 98–110)
Creat: 0.83 mg/dL (ref 0.70–1.30)
Globulin: 2.7 g/dL (ref 1.9–3.7)
Glucose, Bld: 90 mg/dL (ref 65–99)
Potassium: 3.9 mmol/L (ref 3.5–5.3)
Sodium: 140 mmol/L (ref 135–146)
Total Bilirubin: 0.7 mg/dL (ref 0.2–1.2)
Total Protein: 7.2 g/dL (ref 6.1–8.1)
eGFR: 107 mL/min/1.73m2 (ref >=60)

## 2024-06-19 LAB — HEMOGLOBIN A1C
Hgb A1c MFr Bld: 5.5 % (ref <5.7)
Mean Plasma Glucose: 111 mg/dL
eAG (mmol/L): 6.2 mmol/L

## 2024-06-19 LAB — CBC WITH DIFFERENTIAL/PLATELET
Absolute Lymphocytes: 960 {cells}/uL (ref 850–3900)
Absolute Monocytes: 403 {cells}/uL (ref 200–950)
Basophils Absolute: 29 {cells}/uL (ref 0–200)
Basophils Relative: 0.6 %
Eosinophils Absolute: 101 {cells}/uL (ref 15–500)
Eosinophils Relative: 2.1 %
HCT: 45.7 % (ref 38.5–50.0)
Hemoglobin: 15.6 g/dL (ref 13.2–17.1)
MCH: 30.2 pg (ref 27.0–33.0)
MCHC: 34.1 g/dL (ref 32.0–36.0)
MCV: 88.6 fL (ref 80.0–100.0)
MPV: 9.1 fL (ref 7.5–12.5)
Monocytes Relative: 8.4 %
Neutro Abs: 3307 {cells}/uL (ref 1500–7800)
Neutrophils Relative %: 68.9 %
Platelets: 315 Thousand/uL (ref 140–400)
RBC: 5.16 Million/uL (ref 4.20–5.80)
RDW: 12.6 % (ref 11.0–15.0)
Total Lymphocyte: 20 %
WBC: 4.8 Thousand/uL (ref 3.8–10.8)

## 2024-06-19 LAB — PSA: PSA: 0.49 ng/mL (ref <=4.00)

## 2024-06-19 LAB — LIPID PANEL
Cholesterol: 147 mg/dL (ref <200)
HDL: 48 mg/dL (ref >=40)
LDL Cholesterol (Calc): 80 mg/dL
Non-HDL Cholesterol (Calc): 99 mg/dL (ref <130)
Total CHOL/HDL Ratio: 3.1 (calc) (ref <5.0)
Triglycerides: 103 mg/dL (ref <150)

## 2024-06-25 ENCOUNTER — Ambulatory Visit

## 2024-06-25 VITALS — BP 130/90 | HR 94 | Ht 67.0 in | Wt 159.6 lb

## 2024-06-25 DIAGNOSIS — L409 Psoriasis, unspecified: Secondary | ICD-10-CM | POA: Diagnosis not present

## 2024-06-25 DIAGNOSIS — E782 Mixed hyperlipidemia: Secondary | ICD-10-CM | POA: Diagnosis not present

## 2024-06-25 DIAGNOSIS — M5412 Radiculopathy, cervical region: Secondary | ICD-10-CM | POA: Diagnosis not present

## 2024-06-25 NOTE — Progress Notes (Signed)
 Progress Note  Physician: Ismael Treptow A Jordis Repetto, MD   HPI: Christopher Blankenship is a 48 y.o. male presenting on 06/25/2024 for Follow-up .  Discussed the use of AI scribe software for clinical note transcription with the patient, who gave verbal consent to proceed.  History of Present Illness   Christopher Blankenship is a 48 year old male who presents for routine lab review and medication management.  Hyperlipidemia - Currently taking rosuvastatin  10 mg daily   - Lipid Panel     Component Value Date/Time   CHOL 147 06/18/2024 0824   TRIG 103 06/18/2024 0824   HDL 48 06/18/2024 0824   CHOLHDL 3.1 06/18/2024 0824   LDLCALC 80 06/18/2024 0824   - Recent laboratory studies including CBC, CMP, hemoglobin A1c, and PSA all within normal limits  Psoriasis and biologic therapy - Receiving Bimzelx  for psoriasis management - Occasional laboratory monitoring by rheumatology, including annual TB testing  Cervical radiculopathy  - causing neuropathic pain and headaches - Takes gabapentin  twice daily (90 pills with three refills) for neuropathic pain and radiculopathy - Gabapentin  provides effective symptom control - Experiences headaches once or twice weekly, typically frontal  - Headaches possibly related to eye strain as he is having age related vision changes - No associated nausea or vomiting with headaches  Energy and functional status - Works in engineering geologist, walking three to four miles daily - Works 30-32 hours per week  Lifestyle and preventive health - Maintains a healthy diet with meals prepared by his wife - - No history of colonoscopy - will do cologuard next year - No family history of colon cancer - Does not receive annual influenza vaccination         Medical history:  Relevant past medical, surgical, family and social history reviewed and updated as indicated. Interim medical history since our last visit reviewed.  Allergies and medications reviewed and  updated.   ROS: Negative unless specifically indicated above in HPI.    Current Outpatient Medications:    bimekizumab -bkzx (BIMZELX ) 160 MG/ML pen, Inject 2 mLs (320 mg total) into the skin every 8 (eight) weeks. inject 2 pens (320mg /6mL) SQ Q8W, Disp: 2 mL, Rfl: 3   gabapentin  (NEURONTIN ) 100 MG capsule, Take 1 capsule (100 mg total) by mouth 2 (two) times daily., Disp: 90 capsule, Rfl: 3   rosuvastatin  (CRESTOR ) 10 MG tablet, Take 1 tablet (10 mg total) by mouth daily., Disp: 90 tablet, Rfl: 3   valACYclovir  (VALTREX ) 500 MG tablet, Take 1 tablet (500 mg total) by mouth 2 (two) times daily. Take 2 po bid for 5 days prn cold sores, Disp: 30 tablet, Rfl: 11       Objective:     BP (!) 130/90   Pulse 94   Ht 5' 7 (1.702 m)   Wt 159 lb 9.6 oz (72.4 kg)   SpO2 96%   BMI 25.00 kg/m   Wt Readings from Last 3 Encounters:  06/25/24 159 lb 9.6 oz (72.4 kg)  04/03/24 156 lb (70.8 kg)  12/30/22 171 lb 9.6 oz (77.8 kg)    Physical Exam  Physical Exam Vitals reviewed.  Constitutional:      Appearance: Normal appearance. Well-developed with normal weight.  Cardiovascular:     Rate and Rhythm: Normal rate and regular rhythm. Normal heart sounds. Normal peripheral pulses Pulmonary:     Normal breath sounds with normal effort Skin:    General: Skin is warm  and dry without noticeable rash. Neurological:     General: No focal deficit present.  Psychiatric:        Mood and Affect: Mood, behavior and cognition normal      Assessment & Plan:   Encounter Diagnoses  Name Primary?   Cervical radiculopathy Yes   Mixed hyperlipidemia    Psoriasis     No orders of the defined types were placed in this encounter.    Assessment and Plan    Psoriasis Well-managed with Bimzelx .  Chronic neck and back pain with radiculopathy Managed with gabapentin , effective in controlling symptoms. No current radicular symptoms. - Continue gabapentin  as prescribed. - Provided conditioning  exercises for back and neck.  Hyperlipidemia Well-controlled with rosuvastatin . LDL at target range. - Continue rosuvastatin  10 mg daily.  Presbyopia Contributing to headaches due to eye strain. Symptoms include difficulty with near vision. - Recommended vision check for reading glasses.  Tension-type headache Occur once or twice a week, related to presbyopia and neck tension. Gabapentin  and ibuprofen effective. - Recommended vision check for presbyopia. - Continue gabapentin  and ibuprofen as needed.  General Health Maintenance Routine health maintenance discussed. Labs normal.  - Continue routine health maintenance and monitoring. - Consider Cologuard for colon cancer screening next year.   - FH prostate ca - PSA in normal range  F/u every 6 months - next visit in May

## 2024-08-15 ENCOUNTER — Ambulatory Visit: Admitting: Dermatology

## 2024-08-15 ENCOUNTER — Encounter: Payer: Self-pay | Admitting: Dermatology

## 2024-08-15 DIAGNOSIS — Z79899 Other long term (current) drug therapy: Secondary | ICD-10-CM

## 2024-08-15 DIAGNOSIS — L409 Psoriasis, unspecified: Secondary | ICD-10-CM

## 2024-08-15 DIAGNOSIS — B1089 Other human herpesvirus infection: Secondary | ICD-10-CM

## 2024-08-15 DIAGNOSIS — L4050 Arthropathic psoriasis, unspecified: Secondary | ICD-10-CM | POA: Diagnosis not present

## 2024-08-15 DIAGNOSIS — Z1283 Encounter for screening for malignant neoplasm of skin: Secondary | ICD-10-CM

## 2024-08-15 DIAGNOSIS — L578 Other skin changes due to chronic exposure to nonionizing radiation: Secondary | ICD-10-CM

## 2024-08-15 DIAGNOSIS — Z7189 Other specified counseling: Secondary | ICD-10-CM | POA: Diagnosis not present

## 2024-08-15 DIAGNOSIS — L814 Other melanin hyperpigmentation: Secondary | ICD-10-CM | POA: Diagnosis not present

## 2024-08-15 DIAGNOSIS — W908XXA Exposure to other nonionizing radiation, initial encounter: Secondary | ICD-10-CM | POA: Diagnosis not present

## 2024-08-15 DIAGNOSIS — L821 Other seborrheic keratosis: Secondary | ICD-10-CM | POA: Diagnosis not present

## 2024-08-15 DIAGNOSIS — B009 Herpesviral infection, unspecified: Secondary | ICD-10-CM

## 2024-08-15 DIAGNOSIS — D1801 Hemangioma of skin and subcutaneous tissue: Secondary | ICD-10-CM | POA: Diagnosis not present

## 2024-08-15 MED ORDER — VALACYCLOVIR HCL 1 G PO TABS: ORAL_TABLET | ORAL | 11 refills | Status: AC

## 2024-08-15 NOTE — Patient Instructions (Signed)

## 2024-08-15 NOTE — Progress Notes (Signed)
 "   Follow-Up Visit   Subjective  Christopher Blankenship is a 49 y.o. male who presents for the following: Skin Cancer Screening and Full Body Skin Exam 6 month psoriasis and tbse Patient on Bimzelx  injections for psoriasis and psoriatic arthritis. States much improved since last visit. States clearer and less joint pain.   The patient presents for Total-Body Skin Exam (TBSE) for skin cancer screening and mole check. The patient has spots, moles and lesions to be evaluated, some may be new or changing and the patient may have concern these could be cancer.  The following portions of the chart were reviewed this encounter and updated as appropriate: medications, allergies, medical history  Review of Systems:  No other skin or systemic complaints except as noted in HPI or Assessment and Plan.  Objective  Well appearing patient in no apparent distress; mood and affect are within normal limits.  A full examination was performed including scalp, head, eyes, ears, nose, lips, neck, chest, axillae, abdomen, back, buttocks, bilateral upper extremities, bilateral lower extremities, hands, feet, fingers, toes, fingernails, and toenails. All findings within normal limits unless otherwise noted below.   Relevant physical exam findings are noted in the Assessment and Plan.     Assessment & Plan   PSORIASIS  And  Psoriatic Arthritis Clear skin arms, legs,scalp on Bimzlex injection 0% BSA.  While on treatment Chronic and persistent condition with duration or expected duration over one year. Condition is improving with treatment but not currently at goal.  Joints have improved but states was better on Taltz   Treatment Plan: Reviewed labs from 06/18/2024 CBC with Diff ,CMP, LIPID  Chemistries including kidney all OK, Lipids OK, Liver test = GGT normal  Reviewed labs QuantiFERON TB gold plus - ok   Will recheck QuantiFERON TB gold plus at next follow up   Continue Bimzelx  320 mg once every 8 weeks    Clear today   Counseling on psoriasis and coordination of care  psoriasis is a chronic non-curable, but treatable genetic/hereditary disease that may have other systemic features affecting other organ systems such as joints (Psoriatic Arthritis). It is associated with an increased risk of inflammatory bowel disease, heart disease, non-alcoholic fatty liver disease, and depression.  Treatments include light and laser treatments; topical medications; and systemic medications including oral and injectables.   Counseling and coordination of care for severe psoriasis on systemic treatment  psoriasis - severe on systemic treatment.  Psoriasis is a chronic non-curable, but treatable genetic/hereditary disease that may have other systemic features affecting other organ systems such as joints (Psoriatic Arthritis).  It is linked with heart disease, inflammatory bowel disease, non-alcoholic fatty liver disease, and depression. Significant skin psoriasis and/or psoriatic arthritis may have significant symptoms and affects activities of daily activity and often benefits from systemic treatments.  These systemic treatments have some potential side effects including immunosuppression and require pre-treatment laboratory screening and periodic laboratory monitoring and periodic in person evaluation and monitoring by the attending dermatologist physician (long term medication management).    Reviewed risks of biologics including immunosuppression, infections, injection site reaction, and failure to improve condition. Goal is control of skin condition, not cure.  Some older biologics such as Humira and Enbrel  may slightly increase risk of malignancy and may worsen congestive heart failure.  Taltz  and Cosentyx  may cause inflammatory bowel disease to flare. The use of biologics requires long term medication management, including periodic office visits and monitoring of blood work.    SKIN CANCER SCREENING  PERFORMED  TODAY.  ACTINIC DAMAGE - Chronic condition, secondary to cumulative UV/sun exposure - diffuse scaly erythematous macules with underlying dyspigmentation - Recommend daily broad spectrum sunscreen SPF 30+ to sun-exposed areas, reapply every 2 hours as needed.  - Staying in the shade or wearing long sleeves, sun glasses (UVA+UVB protection) and wide brim hats (4-inch brim around the entire circumference of the hat) are also recommended for sun protection.  - Call for new or changing lesions.  LENTIGINES, SEBORRHEIC KERATOSES, HEMANGIOMAS - Benign normal skin lesions - Benign-appearing - Call for any changes  MELANOCYTIC NEVI - Tan-brown and/or pink-flesh-colored symmetric macules and papules - Benign appearing on exam today - Observation - Call clinic for new or changing moles - Recommend daily use of broad spectrum spf 30+ sunscreen to sun-exposed areas.    HERPESVIRAL INFECTION (COLD SORES) Exam: resolving blister L lower lip Chronic and persistent condition with duration or expected duration over one year. Condition is bothersome/symptomatic for patient. Currently flared. Herpes Simplex Virus = Cold Sores = Fever Blisters is a chronic recurring blistering; scabbing sore-producing viral infection that is recurrent usually in the same area triggered by stress, sun/UV exposure and trauma.  It is infectious and can be spread from person to person by direct contact.  It is not curable, but is treatable with topical and oral medication. Treatment Plan Continue Valtrex  1000 gm tablet - take 2 tabs po at first onset of symptoms of fever blister or tingling and then take 2 tabs po 12 hours later Or  May also take 1 tab po daily for prevention if exposed to sun, stress, or trauma 16 tabs total 11 refills  HSV (HERPES SIMPLEX VIRUS) INFECTION   This Visit - valACYclovir  (VALTREX ) 1000 MG tablet - Take 2 tabs by mouth at first onset of symptoms of fever blister or tingling and then take 2  tabs by mouth 12 hours later or can take 1 tab by mouth daily for prevention if exposed to sun stress or trauma  Return in about 6 months (around 02/12/2025) for psoriasis.  IEleanor Blush, CMA, am acting as scribe for Alm Rhyme, MD.   Documentation: I have reviewed the above documentation for accuracy and completeness, and I agree with the above.  Alm Rhyme, MD     "

## 2024-12-11 ENCOUNTER — Ambulatory Visit

## 2025-02-20 ENCOUNTER — Ambulatory Visit: Admitting: Dermatology
